# Patient Record
Sex: Female | Born: 1975 | Race: White | Hispanic: No | Smoking: Current every day smoker
Health system: Southern US, Community
[De-identification: ages and names within clinical notes are randomized; demographics above are authoritative.]

## PROBLEM LIST (undated history)

## (undated) HISTORY — PX: TUBAL LIGATION: SHX77

---

## 2013-01-06 ENCOUNTER — Encounter (HOSPITAL_COMMUNITY): Payer: Self-pay | Admitting: *Deleted

## 2013-01-06 ENCOUNTER — Emergency Department (HOSPITAL_COMMUNITY)
Admission: EM | Admit: 2013-01-06 | Discharge: 2013-01-06 | Disposition: A | Discharge status: Home / Self Care | Payer: Medicaid Other | Attending: Emergency Medicine | Admitting: Emergency Medicine

## 2013-01-06 DIAGNOSIS — Y9389 Activity, other specified: Secondary | ICD-10-CM | POA: Insufficient documentation

## 2013-01-06 DIAGNOSIS — Y9289 Other specified places as the place of occurrence of the external cause: Secondary | ICD-10-CM | POA: Insufficient documentation

## 2013-01-06 DIAGNOSIS — IMO0002 Reserved for concepts with insufficient information to code with codable children: Secondary | ICD-10-CM

## 2013-01-06 DIAGNOSIS — W260XXA Contact with knife, initial encounter: Secondary | ICD-10-CM | POA: Insufficient documentation

## 2013-01-06 DIAGNOSIS — F17200 Nicotine dependence, unspecified, uncomplicated: Secondary | ICD-10-CM | POA: Insufficient documentation

## 2013-01-06 DIAGNOSIS — W261XXA Contact with sword or dagger, initial encounter: Secondary | ICD-10-CM | POA: Insufficient documentation

## 2013-01-06 DIAGNOSIS — S61209A Unspecified open wound of unspecified finger without damage to nail, initial encounter: Secondary | ICD-10-CM | POA: Insufficient documentation

## 2013-01-06 MED ORDER — HYDROCODONE-ACETAMINOPHEN 5-325 MG PO TABS
1.0000 | ORAL_TABLET | Freq: Once | ORAL | Status: AC
  Administered 2013-01-06: 1 via ORAL
  Filled 2013-01-06: qty 1

## 2013-01-06 MED ORDER — HYDROCODONE-ACETAMINOPHEN 5-325 MG PO TABS: 1.0000 | ORAL_TABLET | ORAL | Status: DC | PRN

## 2013-01-06 MED ORDER — LIDOCAINE HCL (PF) 2 % IJ SOLN
INTRAMUSCULAR | Status: AC
  Filled 2013-01-06: qty 10

## 2013-01-06 NOTE — ED Notes (Signed)
Pt with lac to left thumb with butcher knife while stripping wire

## 2013-01-06 NOTE — ED Notes (Signed)
Patient being sutured 

## 2013-01-07 NOTE — ED Provider Notes (Signed)
History     CSN: [id number]  Arrival date & time 01/06/13  2044   First MD Initiated Contact with Patient 01/06/13 2144      Chief Complaint  Patient presents with  . Extremity Laceration    (Consider location/radiation/quality/duration/timing/severity/associated sxs/prior treatment) HPI Comments: Phyllis Allen is a 37 y.o. Female presenting with laceration to her left thumb when she slipped with a sharp knife as she was stripping old wire for recycling.  She denies numbness distal to the injury site and has FROM of her finger.  The wound has bled a lot but is now currently controlled with direct pressure.  She is up to date with her tetanus.     The history is provided by the patient.    No past medical history on file.  No past surgical history on file.  No family history on file.  History  Substance Use Topics  . Smoking status: Current Every Day Smoker    Types: Cigarettes  . Smokeless tobacco: Not on file  . Alcohol Use: No    OB History   Grav Para Term Preterm Abortions TAB SAB Ect Mult Living                  Review of Systems  Constitutional: Negative for fever and chills.  HENT: Negative for facial swelling.   Respiratory: Negative.   Cardiovascular: Negative.   Gastrointestinal: Negative for nausea.  Skin: Positive for wound.  Neurological: Negative for weakness and numbness.    Allergies  Review of patient's allergies indicates no known allergies.  Home Medications   Current Outpatient Rx  Name  Route  Sig  Dispense  Refill  . traMADol (ULTRAM) 50 MG tablet   Oral   Take 50 mg by mouth every 6 (six) hours as needed for pain.         Marland Kitchen HYDROcodone-acetaminophen (NORCO/VICODIN) 5-325 MG per tablet   Oral   Take 1 tablet by mouth every 4 (four) hours as needed for pain.   15 tablet   0     BP 127/105  Pulse 86  Temp(Src) 98.2 F (36.8 C) (Oral)  Resp 18  Ht 5\' 3"  (1.6 m)  Wt 145 lb (65.772 kg)  BMI 25.69 kg/m2  SpO2 98%  LMP  12/26/2012  Physical Exam  Constitutional: She is oriented to person, place, and time. She appears well-developed and well-nourished.  HENT:  Head: Normocephalic.  Cardiovascular: Normal rate.   Pulmonary/Chest: Effort normal.  Musculoskeletal: She exhibits tenderness.  Neurological: She is alert and oriented to person, place, and time. No sensory deficit.  Skin: Laceration noted.  2 cm subcutaneous laceration left lateral thumb involving the distal and proximal phalanxes.  Distal sensation intact,  Pt displays flexion and extension of the digit.  No deep structures visualized.    ED Course  Procedures (including critical care time)   LACERATION REPAIR Performed by: Burgess Amor Authorized by: Burgess Amor Consent: Verbal consent obtained. Risks and benefits: risks, benefits and alternatives were discussed Consent given by: patient Patient identity confirmed: provided demographic data Prepped and Draped in normal sterile fashion Wound explored  Laceration Location: left thumb  Laceration Length: 2cm  No Foreign Bodies seen or palpated.  Penrose drain applied to proximal thumb, secured with needle driver for hemostasis,  Duration 10 minutes.  Anesthesia: local infiltration  Local anesthetic: lidocaine 2% without epinephrine  Anesthetic total: 2 ml  Irrigation method: syringe Amount of cleaning: standard  Skin closure: ethilon 4-0  Number of sutures: 6  Technique: simple interrupted  Patient tolerance: Patient tolerated the procedure well with no immediate complications.   Labs Reviewed - No data to display No results found.   1. Laceration       MDM  Wound care instructions given.  Pt advised to have sutures removed in 10 days,  Return here sooner for any signs of infection including redness, swelling, worse pain or drainage of pus.  Dressing applied by Kingsley Plan, PA-C 01/07/13 0043  Burgess Amor, PA-C 01/07/13 1610

## 2013-01-07 NOTE — ED Provider Notes (Signed)
Medical screening examination/treatment/procedure(s) were performed by non-physician practitioner and as supervising physician I was immediately available for consultation/collaboration.  Blimy Napoleon R. Khaliya Golinski, MD 01/07/13 2326 

## 2013-04-26 ENCOUNTER — Emergency Department (HOSPITAL_COMMUNITY): Payer: Medicaid Other

## 2013-04-26 ENCOUNTER — Emergency Department (HOSPITAL_COMMUNITY)
Admission: EM | Admit: 2013-04-26 | Discharge: 2013-04-26 | Disposition: A | Discharge status: Home / Self Care | Payer: Medicaid Other | Attending: Emergency Medicine | Admitting: Emergency Medicine

## 2013-04-26 DIAGNOSIS — R109 Unspecified abdominal pain: Secondary | ICD-10-CM

## 2013-04-26 DIAGNOSIS — R0602 Shortness of breath: Secondary | ICD-10-CM | POA: Insufficient documentation

## 2013-04-26 DIAGNOSIS — Z79899 Other long term (current) drug therapy: Secondary | ICD-10-CM | POA: Insufficient documentation

## 2013-04-26 DIAGNOSIS — F17200 Nicotine dependence, unspecified, uncomplicated: Secondary | ICD-10-CM | POA: Insufficient documentation

## 2013-04-26 DIAGNOSIS — R197 Diarrhea, unspecified: Secondary | ICD-10-CM | POA: Insufficient documentation

## 2013-04-26 DIAGNOSIS — R51 Headache: Secondary | ICD-10-CM | POA: Insufficient documentation

## 2013-04-26 DIAGNOSIS — Z3202 Encounter for pregnancy test, result negative: Secondary | ICD-10-CM | POA: Insufficient documentation

## 2013-04-26 DIAGNOSIS — R1011 Right upper quadrant pain: Secondary | ICD-10-CM | POA: Insufficient documentation

## 2013-04-26 DIAGNOSIS — R110 Nausea: Secondary | ICD-10-CM | POA: Insufficient documentation

## 2013-04-26 LAB — COMPREHENSIVE METABOLIC PANEL
ALT: 11 U/L (ref 0–35)
AST: 15 U/L (ref 0–37)
Albumin: 3.5 g/dL (ref 3.5–5.2)
Alkaline Phosphatase: 45 U/L (ref 39–117)
Chloride: 102 mEq/L (ref 96–112)
Potassium: 4.3 mEq/L (ref 3.5–5.1)
Sodium: 137 mEq/L (ref 135–145)
Total Bilirubin: 0.2 mg/dL — ABNORMAL LOW (ref 0.3–1.2)
Total Protein: 6.3 g/dL (ref 6.0–8.3)

## 2013-04-26 LAB — CBC WITH DIFFERENTIAL/PLATELET
Basophils Absolute: 0 10*3/uL (ref 0.0–0.1)
Basophils Relative: 0 % (ref 0–1)
Eosinophils Absolute: 0.2 10*3/uL (ref 0.0–0.7)
Eosinophils Relative: 3 % (ref 0–5)
HCT: 40.4 % (ref 36.0–46.0)
Lymphocytes Relative: 53 % — ABNORMAL HIGH (ref 12–46)
MCH: 32.6 pg (ref 26.0–34.0)
MCHC: 33.7 g/dL (ref 30.0–36.0)
MCV: 96.9 fL (ref 78.0–100.0)
Monocytes Absolute: 0.3 10*3/uL (ref 0.1–1.0)
Platelets: 178 10*3/uL (ref 150–400)
RDW: 13.1 % (ref 11.5–15.5)

## 2013-04-26 LAB — URINALYSIS, ROUTINE W REFLEX MICROSCOPIC
Bilirubin Urine: NEGATIVE
Ketones, ur: NEGATIVE mg/dL
Nitrite: NEGATIVE
Protein, ur: NEGATIVE mg/dL
pH: 6.5 (ref 5.0–8.0)

## 2013-04-26 MED ORDER — PROMETHAZINE HCL 25 MG PO TABS: 25.0000 mg | ORAL_TABLET | Freq: Four times a day (QID) | ORAL | Status: DC | PRN

## 2013-04-26 MED ORDER — SODIUM CHLORIDE 0.9 % IV BOLUS (SEPSIS)
500.0000 mL | Freq: Once | INTRAVENOUS | Status: AC
  Administered 2013-04-26: 500 mL via INTRAVENOUS

## 2013-04-26 MED ORDER — IOHEXOL 300 MG/ML  SOLN
100.0000 mL | Freq: Once | INTRAMUSCULAR | Status: AC | PRN
  Administered 2013-04-26: 100 mL via INTRAVENOUS

## 2013-04-26 MED ORDER — ONDANSETRON HCL 4 MG/2ML IJ SOLN
4.0000 mg | Freq: Once | INTRAMUSCULAR | Status: AC
  Administered 2013-04-26: 4 mg via INTRAVENOUS
  Filled 2013-04-26: qty 2

## 2013-04-26 MED ORDER — HYDROCODONE-ACETAMINOPHEN 5-325 MG PO TABS: 1.0000 | ORAL_TABLET | Freq: Four times a day (QID) | ORAL | Status: DC | PRN

## 2013-04-26 MED ORDER — IOHEXOL 300 MG/ML  SOLN
50.0000 mL | Freq: Once | INTRAMUSCULAR | Status: AC | PRN
  Administered 2013-04-26: 50 mL via ORAL

## 2013-04-26 MED ORDER — SODIUM CHLORIDE 0.9 % IV SOLN
INTRAVENOUS | Status: DC
  Administered 2013-04-26: 14:00:00 via INTRAVENOUS

## 2013-04-26 MED ORDER — HYDROMORPHONE HCL PF 1 MG/ML IJ SOLN
1.0000 mg | Freq: Once | INTRAMUSCULAR | Status: AC
  Administered 2013-04-26: 1 mg via INTRAVENOUS
  Filled 2013-04-26: qty 1

## 2013-04-26 NOTE — ED Provider Notes (Signed)
CSN: [id number]     Arrival date & time 04/26/13  4098 History   This chart was scribed for Shelda Jakes, MD, by Yevette Edwards, ED Scribe. This patient was seen in room APA09/APA09 and the patient's care was started at 12:44 PM. First MD Initiated Contact with Patient 04/26/13 1237     No chief complaint on file.  HPI HPI Comments: Phyllis Allen is a 37 y.o. female who presents to the Emergency Department complaining of constant RUQ abdominal pain which began four days ago. She rates the pain as 8/10 and describes as "sharp." She denies any radiation of the abdominal pain. The pt reports that movement and palpation increase the pain. She has experienced nausea, a headache, diarrhea, and SOB as  associated symptoms. She denies any emesis, chest pain, or cold symptoms.  The pt is a daily smoker.   [facility] is where the pt's PCP is located.  No past medical history on file. No past surgical history on file. No family history on file. History  Substance Use Topics  . Smoking status: Current Every Day Smoker    Types: Cigarettes  . Smokeless tobacco: Not on file  . Alcohol Use: No   No OB history provided.  Review of Systems  Constitutional: Negative for fever and chills.  HENT: Negative for congestion, sore throat, rhinorrhea and sneezing.   Eyes: Negative for visual disturbance.  Respiratory: Positive for shortness of breath.   Cardiovascular: Negative for chest pain and leg swelling.  Gastrointestinal: Positive for nausea, abdominal pain and diarrhea (Two days ago; since resolved. Approximately 4 episodes of diarrhea. ). Negative for vomiting and blood in stool.  Genitourinary: Negative for dysuria and hematuria.  Musculoskeletal: Negative for back pain.  Skin: Negative for rash.  Neurological: Positive for headaches (Approximately 3 weeks; not similar to prior headaches).  Hematological: Does not bruise/bleed easily.  All other systems reviewed and are  negative.    Allergies  Amoxicillin  Home Medications   Current Outpatient Rx  Name  Route  Sig  Dispense  Refill  . amphetamine-dextroamphetamine (ADDERALL) 20 MG tablet   Oral   Take 20 mg by mouth 2 (two) times daily.         . diazepam (VALIUM) 10 MG tablet   Oral   Take 5-10 mg by mouth daily as needed for anxiety.         Marland Kitchen lisdexamfetamine (VYVANSE) 40 MG capsule   Oral   Take 40 mg by mouth daily.         . traMADol (ULTRAM) 50 MG tablet   Oral   Take 100 mg by mouth every 6 (six) hours as needed for pain.           Triage Vitals: BP 119/74  Pulse 61  Temp(Src) 98.1 F (36.7 C) (Oral)  Resp 16  Ht 5\' 3"  (1.6 m)  Wt 140 lb (63.504 kg)  BMI 24.81 kg/m2  SpO2 100%  Physical Exam  Nursing note and vitals reviewed. Constitutional: She is oriented to person, place, and time. She appears well-developed and well-nourished. No distress.  HENT:  Head: Normocephalic and atraumatic.  Eyes: EOM are normal.  Neck: Neck supple. No tracheal deviation present.  Cardiovascular: Normal rate, regular rhythm and normal heart sounds.   No murmur heard. Pulmonary/Chest: Effort normal and breath sounds normal. No respiratory distress. She has no wheezes. She has no rales.  Abdominal: There is tenderness. There is no guarding.  Tenderness to RUQ.  Musculoskeletal: Normal range of motion. She exhibits no edema.  Neurological: She is alert and oriented to person, place, and time. No cranial nerve deficit.  Skin: Skin is warm and dry.  Psychiatric: She has a normal mood and affect. Her behavior is normal.    ED Course  Procedures (including critical care time)  DIAGNOSTIC STUDIES: Oxygen Saturation is 100% on room air, normal by my interpretation.    COORDINATION OF CARE:  12:49 PM- Discussed treatment plan with patient, and the patient agreed to the plan.   Labs Review Labs Reviewed  COMPREHENSIVE METABOLIC PANEL - Abnormal; Notable for the following:     Total Bilirubin 0.2 (*)    GFR calc non Af Amer 86 (*)    All other components within normal limits  CBC WITH DIFFERENTIAL - Abnormal; Notable for the following:    Neutrophils Relative % 38 (*)    Lymphocytes Relative 53 (*)    All other components within normal limits  LIPASE, BLOOD  URINALYSIS, ROUTINE W REFLEX MICROSCOPIC  PREGNANCY, URINE   Results for orders placed during the hospital encounter of 04/26/13  LIPASE, BLOOD      Result Value Range   Lipase 48  11 - 59 U/L  COMPREHENSIVE METABOLIC PANEL      Result Value Range   Sodium 137  135 - 145 mEq/L   Potassium 4.3  3.5 - 5.1 mEq/L   Chloride 102  96 - 112 mEq/L   CO2 29  19 - 32 mEq/L   Glucose, Bld 84  70 - 99 mg/dL   BUN 10  6 - 23 mg/dL   Creatinine, Ser 3.24  0.50 - 1.10 mg/dL   Calcium 8.7  8.4 - 40.1 mg/dL   Total Protein 6.3  6.0 - 8.3 g/dL   Albumin 3.5  3.5 - 5.2 g/dL   AST 15  0 - 37 U/L   ALT 11  0 - 35 U/L   Alkaline Phosphatase 45  39 - 117 U/L   Total Bilirubin 0.2 (*) 0.3 - 1.2 mg/dL   GFR calc non Af Amer 86 (*) >90 mL/min   GFR calc Af Amer >90  >90 mL/min  CBC WITH DIFFERENTIAL      Result Value Range   WBC 6.5  4.0 - 10.5 K/uL   RBC 4.17  3.87 - 5.11 MIL/uL   Hemoglobin 13.6  12.0 - 15.0 g/dL   HCT 02.7  25.3 - 66.4 %   MCV 96.9  78.0 - 100.0 fL   MCH 32.6  26.0 - 34.0 pg   MCHC 33.7  30.0 - 36.0 g/dL   RDW 40.3  47.4 - 25.9 %   Platelets 178  150 - 400 K/uL   Neutrophils Relative % 38 (*) 43 - 77 %   Neutro Abs 2.5  1.7 - 7.7 K/uL   Lymphocytes Relative 53 (*) 12 - 46 %   Lymphs Abs 3.5  0.7 - 4.0 K/uL   Monocytes Relative 5  3 - 12 %   Monocytes Absolute 0.3  0.1 - 1.0 K/uL   Eosinophils Relative 3  0 - 5 %   Eosinophils Absolute 0.2  0.0 - 0.7 K/uL   Basophils Relative 0  0 - 1 %   Basophils Absolute 0.0  0.0 - 0.1 K/uL  URINALYSIS, ROUTINE W REFLEX MICROSCOPIC      Result Value Range   Color, Urine YELLOW  YELLOW   APPearance CLEAR  CLEAR   Specific  Gravity, Urine 1.025  1.005  - 1.030   pH 6.5  5.0 - 8.0   Glucose, UA NEGATIVE  NEGATIVE mg/dL   Hgb urine dipstick NEGATIVE  NEGATIVE   Bilirubin Urine NEGATIVE  NEGATIVE   Ketones, ur NEGATIVE  NEGATIVE mg/dL   Protein, ur NEGATIVE  NEGATIVE mg/dL   Urobilinogen, UA 0.2  0.0 - 1.0 mg/dL   Nitrite NEGATIVE  NEGATIVE   Leukocytes, UA NEGATIVE  NEGATIVE  PREGNANCY, URINE      Result Value Range   Preg Test, Ur NEGATIVE  NEGATIVE    Imaging Review Dg Chest 2 View  04/26/2013   CLINICAL DATA:  Right upper quadrant pain  EXAM: CHEST  2 VIEW  COMPARISON:  None.  FINDINGS: The heart size and mediastinal contours are within normal limits. Both lungs are clear. The visualized skeletal structures are unremarkable.  IMPRESSION: No active cardiopulmonary disease.   Electronically Signed   By: Natasha Mead   On: 04/26/2013 13:48   US Abdomen Limited Ruq  04/26/2013   CLINICAL DATA:  Right upper quadrant pain  EXAM: US ABDOMEN LIMITED - RIGHT UPPER QUADRANT  COMPARISON:  None.  FINDINGS: Gallbladder:  No gallstones, gallbladder wall thickening, or pericholecystic fluid.  Common bile duct  Diameter: 2.57mm  Liver:  No focal lesion identified. Within normal limits in parenchymal echogenicity.  IMPRESSION: No acute abnormality noted.   Electronically Signed   By: Alcide Clever   On: 04/26/2013 14:31    MDM   1. Abdominal pain    Patient symptoms suggest of of biliary colic or gallbladder disease. However ultrasound of the gallbladder is completely normal. Will proceed with CT of the abdomen for further evaluation. In addition chest x-ray negative for any right lower lobe pneumonia or other abnormalities. Patient's labs without any significant Dolphus Jenny is no leukocytosis no liver function test abnormalities no evidence of pancreatitis. If CT of abdomen is negative patient can be discharged home on pain medication antinausea medicine.  *I personally performed the services described in this documentation, which was scribed in my  presence. The recorded information has been reviewed and is accurate.     Shelda Jakes, MD 04/26/13 [phone]

## 2013-04-26 NOTE — ED Notes (Signed)
C/o RUQ pain x 3 days - reports onset after lifting laundry basket.  Reports pain 8/10 and "feels like someone punched me".  States pain is worse and sharp with movement and palpation. C/o nausea, denies vomiting; reports onset of diarrhea yesterday; states took imodium one tablet this AM and diarrhea has stopped.

## 2013-04-26 NOTE — ED Notes (Signed)
EDP notified of pt's test results, EDP will review and discuss with pt. Pt notified.

## 2013-04-26 NOTE — Progress Notes (Signed)
No PCP in computer, but pt states PCP is in [facility] [address] in her old doctor's office and she cannot remember her name- this entered in computer

## 2014-03-11 ENCOUNTER — Emergency Department (HOSPITAL_COMMUNITY)
Admission: EM | Admit: 2014-03-11 | Discharge: 2014-03-11 | Disposition: A | Discharge status: Home / Self Care | Payer: Medicaid Other | Attending: Emergency Medicine | Admitting: Emergency Medicine

## 2014-03-11 ENCOUNTER — Encounter (HOSPITAL_COMMUNITY): Payer: Self-pay | Admitting: Emergency Medicine

## 2014-03-11 ENCOUNTER — Emergency Department (HOSPITAL_COMMUNITY): Payer: Medicaid Other

## 2014-03-11 DIAGNOSIS — S0010XA Contusion of unspecified eyelid and periocular area, initial encounter: Secondary | ICD-10-CM | POA: Diagnosis not present

## 2014-03-11 DIAGNOSIS — Z880 Allergy status to penicillin: Secondary | ICD-10-CM | POA: Insufficient documentation

## 2014-03-11 DIAGNOSIS — IMO0002 Reserved for concepts with insufficient information to code with codable children: Secondary | ICD-10-CM | POA: Diagnosis not present

## 2014-03-11 DIAGNOSIS — Y9389 Activity, other specified: Secondary | ICD-10-CM | POA: Diagnosis not present

## 2014-03-11 DIAGNOSIS — F0781 Postconcussional syndrome: Secondary | ICD-10-CM | POA: Insufficient documentation

## 2014-03-11 DIAGNOSIS — Z79899 Other long term (current) drug therapy: Secondary | ICD-10-CM | POA: Insufficient documentation

## 2014-03-11 DIAGNOSIS — S0512XA Contusion of eyeball and orbital tissues, left eye, initial encounter: Secondary | ICD-10-CM

## 2014-03-11 DIAGNOSIS — S0510XA Contusion of eyeball and orbital tissues, unspecified eye, initial encounter: Secondary | ICD-10-CM | POA: Diagnosis present

## 2014-03-11 DIAGNOSIS — Y929 Unspecified place or not applicable: Secondary | ICD-10-CM | POA: Diagnosis not present

## 2014-03-11 DIAGNOSIS — F17200 Nicotine dependence, unspecified, uncomplicated: Secondary | ICD-10-CM | POA: Diagnosis not present

## 2014-03-11 DIAGNOSIS — H1132 Conjunctival hemorrhage, left eye: Secondary | ICD-10-CM

## 2014-03-11 MED ORDER — HYDROCODONE-ACETAMINOPHEN 5-325 MG PO TABS
1.0000 | ORAL_TABLET | Freq: Once | ORAL | Status: AC
  Administered 2014-03-11: 1 via ORAL
  Filled 2014-03-11: qty 1

## 2014-03-11 MED ORDER — TRAMADOL HCL 50 MG PO TABS: 50.0000 mg | ORAL_TABLET | Freq: Four times a day (QID) | ORAL | Status: DC | PRN

## 2014-03-11 NOTE — ED Provider Notes (Signed)
CSN: [id number]     Arrival date & time 03/11/14  1831 History   First MD Initiated Contact with Patient 03/11/14 1847     Chief Complaint  Patient presents with  . Eye Injury     (Consider location/radiation/quality/duration/timing/severity/associated sxs/prior Treatment) HPI  Phyllis Allen is a 38 y.o. female complaining of pain and swelling to left thigh worsening over the course of 4 days when she was hit with a softball in the left temple. Patient states that she may have passed out for a moment at the time. She denies any nausea, vomiting, dysarthria, ataxia, unilateral weakness, cervicalgia. Patient endorses generalized fatigue and sleeping more than normal, mild global headache, blurred vision. Patient does not wear contact lenses, but wears reading glasses.  History reviewed. No pertinent past medical history. Past Surgical History  Procedure Laterality Date  . Tubal ligation     No family history on file. History  Substance Use Topics  . Smoking status: Current Every Day Smoker -- 0.50 packs/day    Types: Cigarettes  . Smokeless tobacco: Not on file  . Alcohol Use: No   OB History   Grav Para Term Preterm Abortions TAB SAB Ect Mult Living                 Review of Systems  10 systems reviewed and found to be negative, except as noted in the HPI.   Allergies  Amoxicillin and Ciprofloxacin  Home Medications   Prior to Admission medications   Medication Sig Start Date End Date Taking? Authorizing Provider  ALPRAZolam Prudy Feeler(XANAX) 1 MG tablet Take 1 mg by mouth 2 (two) times daily.   Yes Historical Provider, MD  amphetamine-dextroamphetamine (ADDERALL) 30 MG tablet Take 30 mg by mouth 2 (two) times daily.   Yes Historical Provider, MD  lisdexamfetamine (VYVANSE) 70 MG capsule Take 70 mg by mouth daily.   Yes Historical Provider, MD  traMADol (ULTRAM) 50 MG tablet Take 1 tablet (50 mg total) by mouth every 6 (six) hours as needed. 03/11/14   Ashan Cueva, PA-C   BP  131/80  Pulse 70  Temp(Src) 98.7 F (37.1 C) (Oral)  Resp 20  Ht 5\' 2"  (1.575 m)  Wt 140 lb (63.504 kg)  BMI 25.60 kg/m2  SpO2 100%  LMP 02/25/2014 Physical Exam  Nursing note and vitals reviewed. Constitutional: She is oriented to person, place, and time. She appears well-developed and well-nourished. No distress.  HENT:  Head: Normocephalic.  Mouth/Throat: Oropharynx is clear and moist.  Left periorbital ecchymoses and subconjunctival hematoma. No chemosis, pupils are equal round and reactive to light, extra ocular movements are intact with pain at far left lateral gaze. There is tenderness but no crepitance to the left lateral orbital rim.  Eyes: Conjunctivae and EOM are normal. Pupils are equal, round, and reactive to light.  Neck:  No midline C-spine  tenderness to palpation or step-offs appreciated. Patient has full range of motion without pain.   Cardiovascular: Normal rate, regular rhythm and intact distal pulses.   Pulmonary/Chest: Effort normal and breath sounds normal. No stridor.  Abdominal: Soft.  Musculoskeletal: Normal range of motion.  Neurological: She is alert and oriented to person, place, and time.  Follows commands, Clear, goal oriented speech, Strength is 5 out of 5x4 extremities, patient ambulates with a coordinated in nonantalgic gait. Sensation is grossly intact.   Psychiatric: She has a normal mood and affect.    ED Course  Procedures (including critical care time) Labs Review Labs  Reviewed - No data to display  Imaging Review Ct Head Wo Contrast  03/11/2014   CLINICAL DATA:  Hit in left orbit with softball.  EXAM: CT HEAD WITHOUT CONTRAST  CT MAXILLOFACIAL WITHOUT CONTRAST  TECHNIQUE: Multidetector CT imaging of the head and maxillofacial structures were performed using the standard protocol without intravenous contrast. Multiplanar CT image reconstructions of the maxillofacial structures were also generated.  COMPARISON:  04/26/2013  FINDINGS: CT HEAD  FINDINGS  No acute intracranial abnormality. Specifically, no hemorrhage, hydrocephalus, mass lesion, acute infarction, or significant intracranial injury. No acute calvarial abnormality. Visualized paranasal sinuses and mastoids clear. Orbital soft tissues unremarkable.  CT MAXILLOFACIAL FINDINGS  Soft tissue swelling over the left orbit and face. No orbital fracture. Zygomatic arches and mandible intact. Nasal bones intact. Paranasal sinuses clear. Orbital soft tissues unremarkable.  IMPRESSION: No evidence of orbital/facial fracture.  No acute intracranial abnormality.   Electronically Signed   By: Charlett Nose M.D.   On: 03/11/2014 19:19   Ct Maxillofacial Wo Cm  03/11/2014   CLINICAL DATA:  Hit in left orbit with softball.  EXAM: CT HEAD WITHOUT CONTRAST  CT MAXILLOFACIAL WITHOUT CONTRAST  TECHNIQUE: Multidetector CT imaging of the head and maxillofacial structures were performed using the standard protocol without intravenous contrast. Multiplanar CT image reconstructions of the maxillofacial structures were also generated.  COMPARISON:  04/26/2013  FINDINGS: CT HEAD FINDINGS  No acute intracranial abnormality. Specifically, no hemorrhage, hydrocephalus, mass lesion, acute infarction, or significant intracranial injury. No acute calvarial abnormality. Visualized paranasal sinuses and mastoids clear. Orbital soft tissues unremarkable.  CT MAXILLOFACIAL FINDINGS  Soft tissue swelling over the left orbit and face. No orbital fracture. Zygomatic arches and mandible intact. Nasal bones intact. Paranasal sinuses clear. Orbital soft tissues unremarkable.  IMPRESSION: No evidence of orbital/facial fracture.  No acute intracranial abnormality.   Electronically Signed   By: Charlett Nose M.D.   On: 03/11/2014 19:19     EKG Interpretation None      MDM   Final diagnoses:  Periorbital contusion of left eye, initial encounter  Subconjunctival hematoma, left  Post concussion syndrome    Filed Vitals:    03/11/14 1843  BP: 131/80  Pulse: 70  Temp: 98.7 F (37.1 C)  TempSrc: Oral  Resp: 20  Height: 5\' 2"  (1.575 m)  Weight: 140 lb (63.504 kg)  SpO2: 100%    Medications  HYDROcodone-acetaminophen (NORCO/VICODIN) 5-325 MG per tablet 1 tablet (1 tablet Oral Given 03/11/14 1859)    Phyllis Allen is a 38 y.o. female presenting with left periorbital ecchymoses after she was hit with a softball 4 days ago. Extraocular movements are intact, there is no overt entrapment. Patient has headache which is atypical for her in addition she is generally fatigued and sleeping more than normal here. Likely postconcussive syndrome however I will order a CAT scan as patient had loss of consciousness.   Maxillofacial and head CT are both negative. We have discussed secondary impact syndrome and I advised her not to undertake any activity that could result in head trauma. Patient and her family verbalize her understanding. We'll give her referral to neurology and resource guide for establishing primary care.  Evaluation does not show pathology that would require ongoing emergent intervention or inpatient treatment. Pt is hemodynamically stable and mentating appropriately. Discussed findings and plan with patient/guardian, who agrees with care plan. All questions answered. Return precautions discussed and outpatient follow up given.   New Prescriptions   TRAMADOL (ULTRAM) 50 MG  TABLET    Take 1 tablet (50 mg total) by mouth every 6 (six) hours as needed.         Wynetta Emery, PA-C 03/11/14 1932

## 2014-03-11 NOTE — ED Notes (Signed)
Pt hit in eye with softball x 4 days ago.

## 2014-03-11 NOTE — Discharge Instructions (Signed)
Do not participate in any sports or any activities that could result in head trauma until you are cleared by your  primary care physician or neurologist.    Do not hesitate to return to the emergency room for any new, worsening or concerning symptoms.  Please obtain primary care using resource guide below. But the minute you were seen in the emergency room and that they will need to obtain records for further outpatient management.   Emergency Department Resource Guide 1) Find a Doctor and Pay Out of Pocket Although you won't have to find out who is covered by your insurance plan, it is a good idea to ask around and get recommendations. You will then need to call the office and see if the doctor you have chosen will accept you as a new patient and what types of options they offer for patients who are self-pay. Some doctors offer discounts or will set up payment plans for their patients who do not have insurance, but you will need to ask so you aren't surprised when you get to your appointment.  2) Contact Your Local Health Department Not all health departments have doctors that can see patients for sick visits, but many do, so it is worth a call to see if yours does. If you don't know where your local health department is, you can check in your phone book. The CDC also has a tool to help you locate your state's health department, and many state websites also have listings of all of their local health departments.  3) Find a Walk-in Clinic If your illness is not likely to be very severe or complicated, you may want to try a walk in clinic. These are popping up all over the country in pharmacies, drugstores, and shopping centers. They're usually staffed by nurse practitioners or physician assistants that have been trained to treat common illnesses and complaints. They're usually fairly quick and inexpensive. However, if you have serious medical issues or chronic medical problems, these are probably not  your best option.  No Primary Care Doctor: - Call Health Connect at  [phone] - they can help you locate a primary care doctor that  accepts your insurance, provides certain services, etc. - Physician Referral Service- [phone]  Chronic Pain Problems: Organization         Address  Phone   Notes  [facility]  [phone] Patients need to be referred by their primary care doctor.   Medication Assistance: Organization         Address  Phone   Notes  [facility] Medication [facility] [address] [place], [place] [address] [phone] --Must be a resident of [facility] -- Must have NO insurance coverage whatsoever (no Medicaid/ Medicare, etc.) -- The pt. MUST have a primary care doctor that directs their care regularly and follows them in the community   [facility]ist  [phone]   [employer]  [phone]    Agencies that provide inexpensive medical care: Organization         Address  Phone   Notes  [facility]  [phone]   [facility]    [phone]   [facility] [address] [place], [place] [address] [phone]   [facility] [address] [place] [place] [phone]   Planned Parenthood    [phone]   [facility]    [phone])  [place]  [address], [place] Phone:  [phone], Fax:  [phone] Hours of Operation:  9 am - 6 pm, M-F.  Also accepts Medicaid/Medicare and self-pay.  [facility] for [place], [address], [place] Phone: [phone], Fax: [phone]. Hours of Operation:  8:30 am - 5:30 pm, M-F.  Also accepts Medicaid and self-pay.  [facility] [place] [address] [place] Phone: [phone]   [place], [place], [place] [phone], Ext. [phone] Mondays & Thursdays: 7-9 AM.  First  15 patients are seen on a first come, first serve basis.    [place] Providers:  Organization         Address  Phone   Notes  [facility] [address],  [phone] Also accepts self-pay patients.  [facility] [address], [place]  [phone]   [place], [address], [place] [phone]   [facility] [address], [place] [phone]   Lucianne Lei [address], [place]   [phone] Only accepts [place] Access [place] patients after they have their name applied to their card.   Self-Pay (no insurance) in [facility]:  Organization         Address  Phone   Notes  Sickle Cell Patients, [facility] Internal Medicine [place] [phone]   [facility] [place] [phone]   [facility] [place]  [place], [place], [place] [phone]   [facility]/Dr. Osei-Bonsu  [address] [place] or [address], [place] [phone] Phone number for both [place] and [place] locations is the same.  [facility] and [place] [address] [place] [phone]   [facility] [address] [place] or [address] [phone]   [facility] [address] [place] [phone], phone; [phone], fax Sees patients 1st and 3rd Saturday of every month.  Must not qualify for public or private insurance (i.e. Medicaid, Medicare, [place] Health Choice, Veterans' Benefits)  Household income should be no more than 200% of the poverty level The clinic cannot treat you if you are pregnant or think you are pregnant  Sexually transmitted diseases are not treated at the clinic.    Dental  Care: Organization         Address  Phone  Notes  [facility] Department of [facility] [place] [phone] Accepts children up to age 29 who are enrolled in [place] or [place]; pregnant women with a Medicaid card; and children who have applied for Medicaid or [place] Health Choice, but were declined, whose parents can pay a reduced fee at time of service.  [facility] of [facility]  [address], [place] [phone] Accepts children up to age 38 who are enrolled in [place] or [place]; pregnant women with a Medicaid card; and children who have applied for Medicaid or [place] Health Choice, but were declined, whose parents can pay a reduced fee at time of service.  [place] Adult Dental Access  PROGRAM  [place] [phone] Patients are seen by appointment only. Walk-ins are not accepted. [place] will see patients 24 years of age and older. Monday - Tuesday (8am-5pm) Most Wednesdays (8:30-5pm) $30 per visit, cash only  [facility] Adult Dental Access PROGRAM  [address], [facility] [phone] Patients are seen by appointment only. Walk-ins are not accepted. [place] will see patients 16 years of age and older. One Wednesday Evening (Monthly: Volunteer Based).  $30 per visit, cash only  [place]  [phone] for adults; Children under age 49, call Graduate Pediatric Dentistry at [phone]. Children aged 70-14, please call [phone] to request a pediatric application.  Dental services are provided in all areas of dental care including fillings, crowns and bridges, complete and partial dentures, implants, gum treatment, root canals, and extractions. Preventive care is also provided. Treatment is provided to both adults and children. Patients are selected via a lottery and there is often a waiting list.   [facility] [address], [place]  [phone] [email or web address]   [facility] [address] [place], [place] [phone], Ext. [phone] Second and Fourth Thursday of each month, opens at 6:30 AM; Clinic ends at 9 AM.  Patients are seen on a first-come first-served basis, and a limited number are seen during each clinic.   [facility]  [address] Hillard Danker [place], [place] [phone]   Eligibility Requirements You must have lived in [place], [place], or [place] counties for at least the last three months.   You cannot be eligible for state or federal sponsored [employer], including [employer], [place], or [employer].   You generally cannot be eligible for healthcare insurance through your employer.    How to apply: Eligibility screenings are held every Tuesday and Wednesday afternoon from 1:00 pm until 4:00 pm. You do not need an appointment for the interview!  [facility] [address], [place], [place]   [place]  [place]  Jette  [phone]    Behavioral Health Resources in the Community: Intensive Outpatient Programs Organization         Address  Phone  Notes  [place]. [address] [place], [place] [phone]   [facility] Outpatient [address], [place], [place]   ADS: Alcohol & Drug S[facility] [address] [place], [place]   [place] [address],  [place], [place] or [phone]   Substance Abuse Resources Organization         Address  Phone  Notes  Alcohol and Drug Services  [phone]   [place]  [phone]   [facility] [place]   Chinita Pester  [phone]   Residential & Outpatient Substance Abuse Program  [phone]    Psychological Services Organization         Address  Phone  Notes  [facility] [place]  [place]  [phone]   [place] [address], [place] or [phone]    Mobile Crisis Teams Organization         Address  Phone  Notes  [facility], Mobile Crisis Care Unit  [phone]   [facility]  [address] [place], [place]  [phone]   [facility] [address] [place] [phone]    Self-Help/Support Groups Organization         Address  Phone             Notes  [facility] [place] - variety of support groups  336- [phone] Call for more information  Narcotics Anonymous (NA), [facility] [address], [place]  2 meetings at this location   Statisticianesidential Treatment Programs Organization         Address  Phone  Notes  [facility] [address],    [place]  [phone]   [facility]  [address], [place] [phone], [place], [place] [phone]   [facility] [address], [place] [phone] Admissions: 8am-3pm M-F  [facility] [address]    [place], [place] [phone]   [facility] [address], [place], [place] [phone]   [facility] [address]  [place], [place] [phone]   [facility] - Intensive Outpatient [address], [place], [place] [phone]   [facility] (Addiction Recovery Care [facility] [address]  [place], [place] [phone] or [phone]   [facility] ([facility]) [address] [place], [place] [phone] Accepts Medicaid  [facility] [place] [address]  [place] [phone] Substance Abuse/Addiction Treatment   [facility] Behavioral Health Resources Organization         Address  Phone  Notes  [facility]  [phone]   Angie FavaJulie Brannon, PhD [address], [address] [place], [place]   [phone] or [phone]   [facility]   [address] [place], [place] [phone]   [facility] [address], [place], [place] [phone] Insurance/Medicaid/sponsorship through [facility]  [facility] [address]                                    [place], [place] [phone] Therapy/tele-psych/case  [facility] [address] St[address].   [place], [place] [phone]    Dr. Lolly MustacheArfeen  [phone]   [facility]  [employer] [facility] 1) [address], [place] 2) [address], [place] 3)  [address], [place] [phone]  [phone]   [facility] Child Abuse Hotline [phone]) 3600092051 or [phone]) [phone] (After Hours)       Concussion A concussion, or closed-head injury, is a brain injury caused by a direct blow to the head or by a quick and sudden movement (jolt) of the head or neck. Concussions are usually not life-threatening. Even so, the effects of a concussion can be serious. If you have had a concussion before, you are more likely to experience concussion-like symptoms after a direct blow to the head.  CAUSES  Direct blow to the head, such as from running into another player during a soccer game, being hit in a fight, or hitting your head on a hard surface.  A jolt of the head or neck that causes the brain to move back and forth inside the skull, such as in a car crash. SIGNS AND SYMPTOMS The signs of a concussion can be hard to notice. Early on, they may be missed by you, family members, and health  care providers. You may look fine but act or feel differently. Symptoms are usually temporary, but they may last for days, weeks, or even longer. Some symptoms may appear right away while others may not show up for hours or days. Every head injury is different. Symptoms include:  Mild to  moderate headaches that will not go away.  A feeling of pressure inside your head.  Having more trouble than usual:  Learning or remembering things you have heard.  Answering questions.  Paying attention or concentrating.  Organizing daily tasks.  Making decisions and solving problems.  Slowness in thinking, acting or reacting, speaking, or reading.  Getting lost or being easily confused.  Feeling tired all the time or lacking energy (fatigued).  Feeling drowsy.  Sleep disturbances.  Sleeping more than usual.  Sleeping less than usual.  Trouble falling asleep.  Trouble sleeping (insomnia).  Loss of balance or feeling lightheaded or dizzy.  Nausea or vomiting.  Numbness or tingling.  Increased sensitivity to:  Sounds.  Lights.  Distractions.  Vision problems or eyes that tire easily.  Diminished sense of taste or smell.  Ringing in the ears.  Mood changes such as feeling sad or anxious.  Becoming easily irritated or angry for little or no reason.  Lack of motivation.  Seeing or hearing things other people do not see or hear (hallucinations). DIAGNOSIS Your health care provider can usually diagnose a concussion based on a description of your injury and symptoms. He or she will ask whether you passed out (lost consciousness) and whether you are having trouble remembering events that happened right before and during your injury. Your evaluation might include:  A brain scan to look for signs of injury to the brain. Even if the test shows no injury, you may still have a concussion.  Blood tests to be sure other problems are not present. TREATMENT  Concussions are usually treated in an emergency department, in urgent care, or at a clinic. You may need to stay in the hospital overnight for further treatment.  Tell your health care provider if you are taking any medicines, including prescription medicines, over-the-counter medicines, and natural  remedies. Some medicines, such as blood thinners (anticoagulants) and aspirin, may increase the chance of complications. Also tell your health care provider whether you have had alcohol or are taking illegal drugs. This information may affect treatment.  Your health care provider will send you home with important instructions to follow.  How fast you will recover from a concussion depends on many factors. These factors include how severe your concussion is, what part of your brain was injured, your age, and how healthy you were before the concussion.  Most people with mild injuries recover fully. Recovery can take time. In general, recovery is slower in older persons. Also, persons who have had a concussion in the past or have other medical problems may find that it takes longer to recover from their current injury. HOME CARE INSTRUCTIONS General Instructions  Carefully follow the directions your health care provider gave you.  Only take over-the-counter or prescription medicines for pain, discomfort, or fever as directed by your health care provider.  Take only those medicines that your health care provider has approved.  Do not drink alcohol until your health care provider says you are well enough to do so. Alcohol and certain other drugs may slow your recovery and can put you at risk of further injury.  If it is harder than usual to remember things, write  them down.  If you are easily distracted, try to do one thing at a time. For example, do not try to watch TV while fixing dinner.  Talk with family members or close friends when making important decisions.  Keep all follow-up appointments. Repeated evaluation of your symptoms is recommended for your recovery.  Watch your symptoms and tell others to do the same. Complications sometimes occur after a concussion. Older adults with a brain injury may have a higher risk of serious complications, such as a blood clot on the brain.  Tell  your teachers, school nurse, school counselor, coach, athletic trainer, or work Production designer, theatre/television/film about your injury, symptoms, and restrictions. Tell them about what you can or cannot do. They should watch for:  Increased problems with attention or concentration.  Increased difficulty remembering or learning new information.  Increased time needed to complete tasks or assignments.  Increased irritability or decreased ability to cope with stress.  Increased symptoms.  Rest. Rest helps the brain to heal. Make sure you:  Get plenty of sleep at night. Avoid staying up late at night.  Keep the same bedtime hours on weekends and weekdays.  Rest during the day. Take daytime naps or rest breaks when you feel tired.  Limit activities that require a lot of thought or concentration. These include:  Doing homework or job-related work.  Watching TV.  Working on the computer.  Avoid any situation where there is potential for another head injury (football, hockey, soccer, basketball, martial arts, downhill snow sports and horseback riding). Your condition will get worse every time you experience a concussion. You should avoid these activities until you are evaluated by the appropriate follow-up health care providers. Returning To Your Regular Activities You will need to return to your normal activities slowly, not all at once. You must give your body and brain enough time for recovery.  Do not return to sports or other athletic activities until your health care provider tells you it is safe to do so.  Ask your health care provider when you can drive, ride a bicycle, or operate heavy machinery. Your ability to react may be slower after a brain injury. Never do these activities if you are dizzy.  Ask your health care provider about when you can return to work or school. Preventing Another Concussion It is very important to avoid another brain injury, especially before you have recovered. In rare cases,  another injury can lead to permanent brain damage, brain swelling, or death. The risk of this is greatest during the first 7-10 days after a head injury. Avoid injuries by:  Wearing a seat belt when riding in a car.  Drinking alcohol only in moderation.  Wearing a helmet when biking, skiing, skateboarding, skating, or doing similar activities.  Avoiding activities that could lead to a second concussion, such as contact or recreational sports, until your health care provider says it is okay.  Taking safety measures in your home.  Remove clutter and tripping hazards from floors and stairways.  Use grab bars in bathrooms and handrails by stairs.  Place non-slip mats on floors and in bathtubs.  Improve lighting in dim areas. SEEK MEDICAL CARE IF:  You have increased problems paying attention or concentrating.  You have increased difficulty remembering or learning new information.  You need more time to complete tasks or assignments than before.  You have increased irritability or decreased ability to cope with stress.  You have more symptoms than before. Seek medical care if  you have any of the following symptoms for more than 2 weeks after your injury:  Lasting (chronic) headaches.  Dizziness or balance problems.  Nausea.  Vision problems.  Increased sensitivity to noise or light.  Depression or mood swings.  Anxiety or irritability.  Memory problems.  Difficulty concentrating or paying attention.  Sleep problems.  Feeling tired all the time. SEEK IMMEDIATE MEDICAL CARE IF:  You have severe or worsening headaches. These may be a sign of a blood clot in the brain.  You have weakness (even if only in one hand, leg, or part of the face).  You have numbness.  You have decreased coordination.  You vomit repeatedly.  You have increased sleepiness.  One pupil is larger than the other.  You have convulsions.  You have slurred speech.  You have increased  confusion. This may be a sign of a blood clot in the brain.  You have increased restlessness, agitation, or irritability.  You are unable to recognize people or places.  You have neck pain.  It is difficult to wake you up.  You have unusual behavior changes.  You lose consciousness. MAKE SURE YOU:  Understand these instructions.  Will watch your condition.  Will get help right away if you are not doing well or get worse. Document Released: 10/04/2003 Document Revised: 07/19/2013 Document Reviewed: 02/03/2013 [facility] Patient Information 2015 [place], [place]. This information is not intended to replace advice given to you by your health care provider. Make sure you discuss any questions you have with your health care provider.

## 2014-03-12 NOTE — ED Provider Notes (Signed)
Medical screening examination/treatment/procedure(s) were performed by non-physician practitioner and as supervising physician I was immediately available for consultation/collaboration.    Linwood DibblesJon Briannon Boggio, MD 03/12/14 [phone]

## 2014-05-09 ENCOUNTER — Encounter (HOSPITAL_COMMUNITY): Payer: Self-pay | Admitting: Emergency Medicine

## 2014-05-09 ENCOUNTER — Emergency Department (HOSPITAL_COMMUNITY)
Admission: EM | Admit: 2014-05-09 | Discharge: 2014-05-09 | Disposition: A | Discharge status: Home / Self Care | Payer: Medicaid Other | Attending: Emergency Medicine | Admitting: Emergency Medicine

## 2014-05-09 DIAGNOSIS — Z79899 Other long term (current) drug therapy: Secondary | ICD-10-CM | POA: Insufficient documentation

## 2014-05-09 DIAGNOSIS — R51 Headache: Secondary | ICD-10-CM | POA: Diagnosis present

## 2014-05-09 DIAGNOSIS — Z880 Allergy status to penicillin: Secondary | ICD-10-CM | POA: Insufficient documentation

## 2014-05-09 DIAGNOSIS — Z720 Tobacco use: Secondary | ICD-10-CM | POA: Diagnosis not present

## 2014-05-09 DIAGNOSIS — R519 Headache, unspecified: Secondary | ICD-10-CM

## 2014-05-09 MED ORDER — KETOROLAC TROMETHAMINE 30 MG/ML IJ SOLN
30.0000 mg | Freq: Once | INTRAMUSCULAR | Status: AC
Start: 2014-05-09 — End: 2014-05-09
  Administered 2014-05-09: 30 mg via INTRAVENOUS
  Filled 2014-05-09: qty 1

## 2014-05-09 MED ORDER — DIPHENHYDRAMINE HCL 50 MG/ML IJ SOLN
25.0000 mg | Freq: Once | INTRAMUSCULAR | Status: AC
  Administered 2014-05-09: 25 mg via INTRAVENOUS
  Filled 2014-05-09: qty 1

## 2014-05-09 MED ORDER — METOCLOPRAMIDE HCL 5 MG/ML IJ SOLN
10.0000 mg | Freq: Once | INTRAMUSCULAR | Status: AC
Start: 2014-05-09 — End: 2014-05-09
  Administered 2014-05-09: 10 mg via INTRAVENOUS
  Filled 2014-05-09: qty 2

## 2014-05-09 MED ORDER — SODIUM CHLORIDE 0.9 % IV BOLUS (SEPSIS)
1000.0000 mL | Freq: Once | INTRAVENOUS | Status: AC
  Administered 2014-05-09: 1000 mL via INTRAVENOUS

## 2014-05-09 NOTE — ED Provider Notes (Signed)
CSN: [id number]     Arrival date & time 05/09/14  1647 History  This chart was scribed for Donnetta Hutching, MD by Milly Jakob, ED Scribe. The patient was seen in room APA01/APA01. Patient's care was started at 6:27 PM.   Chief Complaint  Patient presents with  . Headache   The history is provided by the patient. No language interpreter was used.   HPI Comments: Phyllis Allen is a 38 y.o. female who presents to the Emergency Department complaining of a severe headache behind her left eye that radiates into her right frontal area, which began last night. She reports ocasional blurred vision in her left eye. She took [place] powder without relief. She reports that 2 months ago she was hit in the left eye with a softball. She states that she was seen in the hospital for a headache one time in the past at [facility], and that this was diagnosed as a sinus infection. She reports taking Tramadol for arthritis.   History reviewed. No pertinent past medical history. Past Surgical History  Procedure Laterality Date  . Tubal ligation     History reviewed. No pertinent family history. History  Substance Use Topics  . Smoking status: Current Every Day Smoker -- 0.50 packs/day    Types: Cigarettes  . Smokeless tobacco: Not on file  . Alcohol Use: No   OB History   Grav Para Term Preterm Abortions TAB SAB Ect Mult Living                 Review of Systems A complete 10 system review of systems was obtained and all systems are negative except as noted in the HPI and PMH.   Allergies  Amoxicillin and Ciprofloxacin  Home Medications   Prior to Admission medications   Medication Sig Start Date End Date Taking? Authorizing Provider  ALPRAZolam Prudy Feeler) 1 MG tablet Take 0.5-1 mg by mouth 3 (three) times daily as needed for anxiety.    Yes Historical Provider, MD  amphetamine-dextroamphetamine (ADDERALL) 30 MG tablet Take 30 mg by mouth 2 (two) times daily.   Yes Historical Provider, MD   Aspirin-Acetaminophen-Caffeine (GOODY HEADACHE PO) Take 1 packet by mouth daily as needed (for pain).   Yes Historical Provider, MD  lisdexamfetamine (VYVANSE) 70 MG capsule Take 70 mg by mouth daily.   Yes Historical Provider, MD   Triage Vitals: BP 103/76  Pulse 87  Temp(Src) 99.1 F (37.3 C) (Oral)  Resp 18  Ht 5\' 3"  (1.6 m)  Wt 135 lb (61.236 kg)  BMI 23.92 kg/m2  SpO2 100%  LMP 05/06/2014 Physical Exam  Nursing note and vitals reviewed. Constitutional: She is oriented to person, place, and time. She appears well-developed and well-nourished.  HENT:  Head: Normocephalic and atraumatic.  Eyes: Conjunctivae and EOM are normal. Pupils are equal, round, and reactive to light.  Neck: Normal range of motion. Neck supple.  Cardiovascular: Normal rate, regular rhythm and normal heart sounds.   Pulmonary/Chest: Effort normal and breath sounds normal.  Abdominal: Soft. Bowel sounds are normal.  Musculoskeletal: Normal range of motion.  Neurological: She is alert and oriented to person, place, and time.  Skin: Skin is warm and dry.  Psychiatric: She has a normal mood and affect. Her behavior is normal.    ED Course  Procedures (including critical care time) DIAGNOSTIC STUDIES: Oxygen Saturation is 100% on room air, normal by my interpretation.    COORDINATION OF CARE: 6:31 PM-Discussed treatment plan which includes IV fluids,  Toradol. Benadryl, and Reglan with pt at bedside and pt agreed to plan.   Results for orders placed during the hospital encounter of 04/26/13  LIPASE, BLOOD      Result Value Ref Range   Lipase 48  11 - 59 U/L  COMPREHENSIVE METABOLIC PANEL      Result Value Ref Range   Sodium 137  135 - 145 mEq/L   Potassium 4.3  3.5 - 5.1 mEq/L   Chloride 102  96 - 112 mEq/L   CO2 29  19 - 32 mEq/L   Glucose, Bld 84  70 - 99 mg/dL   BUN 10  6 - 23 mg/dL   Creatinine, Ser 1.610.86  0.50 - 1.10 mg/dL   Calcium 8.7  8.4 - 09.610.5 mg/dL   Total Protein 6.3  6.0 - 8.3 g/dL    Albumin 3.5  3.5 - 5.2 g/dL   AST 15  0 - 37 U/L   ALT 11  0 - 35 U/L   Alkaline Phosphatase 45  39 - 117 U/L   Total Bilirubin 0.2 (*) 0.3 - 1.2 mg/dL   GFR calc non Af Amer 86 (*) >90 mL/min   GFR calc Af Amer >90  >90 mL/min  CBC WITH DIFFERENTIAL      Result Value Ref Range   WBC 6.5  4.0 - 10.5 K/uL   RBC 4.17  3.87 - 5.11 MIL/uL   Hemoglobin 13.6  12.0 - 15.0 g/dL   HCT 04.540.4  40.936.0 - 81.146.0 %   MCV 96.9  78.0 - 100.0 fL   MCH 32.6  26.0 - 34.0 pg   MCHC 33.7  30.0 - 36.0 g/dL   RDW 91.413.1  78.211.5 - 95.615.5 %   Platelets 178  150 - 400 K/uL   Neutrophils Relative % 38 (*) 43 - 77 %   Neutro Abs 2.5  1.7 - 7.7 K/uL   Lymphocytes Relative 53 (*) 12 - 46 %   Lymphs Abs 3.5  0.7 - 4.0 K/uL   Monocytes Relative 5  3 - 12 %   Monocytes Absolute 0.3  0.1 - 1.0 K/uL   Eosinophils Relative 3  0 - 5 %   Eosinophils Absolute 0.2  0.0 - 0.7 K/uL   Basophils Relative 0  0 - 1 %   Basophils Absolute 0.0  0.0 - 0.1 K/uL  URINALYSIS, ROUTINE W REFLEX MICROSCOPIC      Result Value Ref Range   Color, Urine YELLOW  YELLOW   APPearance CLEAR  CLEAR   Specific Gravity, Urine 1.025  1.005 - 1.030   pH 6.5  5.0 - 8.0   Glucose, UA NEGATIVE  NEGATIVE mg/dL   Hgb urine dipstick NEGATIVE  NEGATIVE   Bilirubin Urine NEGATIVE  NEGATIVE   Ketones, ur NEGATIVE  NEGATIVE mg/dL   Protein, ur NEGATIVE  NEGATIVE mg/dL   Urobilinogen, UA 0.2  0.0 - 1.0 mg/dL   Nitrite NEGATIVE  NEGATIVE   Leukocytes, UA NEGATIVE  NEGATIVE  PREGNANCY, URINE      Result Value Ref Range   Preg Test, Ur NEGATIVE  NEGATIVE    Imaging Review No results found.   EKG Interpretation None      MDM   Final diagnoses:  Headache, unspecified headache type   No neurological deficits. Patient feels better. He fluids, IV Benadryl, IV Reglan, IV Toradol  I personally performed the services described in this documentation, which was scribed in my presence. The recorded information has been  reviewed and is accurate.    Donnetta HutchingBrian  Sakeenah Valcarcel, MD 05/09/14 [phone]

## 2014-05-09 NOTE — ED Notes (Addendum)
Pt c/o sharp "piercing" pains that started to left side of temple and now on both sides. States it lasts all day. Symptoms since yesterday. Dizziness with walking but states she is steady, "little" blurred vision that started today. Pt was hit with baseball to left eye 2 months ago. Alert/oriented. Pupils perrla. Pt has bruising under left eye that she states is still from the baseball 2 months ago.

## 2014-05-09 NOTE — Discharge Instructions (Signed)
Increase fluids. Rest in quiet dark room. °

## 2015-01-16 IMAGING — CT CT MAXILLOFACIAL W/O CM
3 of 4 series · 16 of 47 positions shown, 19 images · non-contrast
Comparison: 04/26/2013

CLINICAL DATA: Hit in left orbit with softball.

EXAM:
CT HEAD WITHOUT CONTRAST
CT MAXILLOFACIAL WITHOUT CONTRAST
TECHNIQUE: Multidetector CT imaging of the head and maxillofacial structures
were performed using the standard protocol without intravenous
contrast. Multiplanar CT image reconstructions of the maxillofacial
structures were also generated.

[Series 4: max st 2.0 h31s · axial · 0.32mm/px · z∈[+24,+156]mm · 10 of 78 slices shown, 13 images]
[im 8/78  brain]
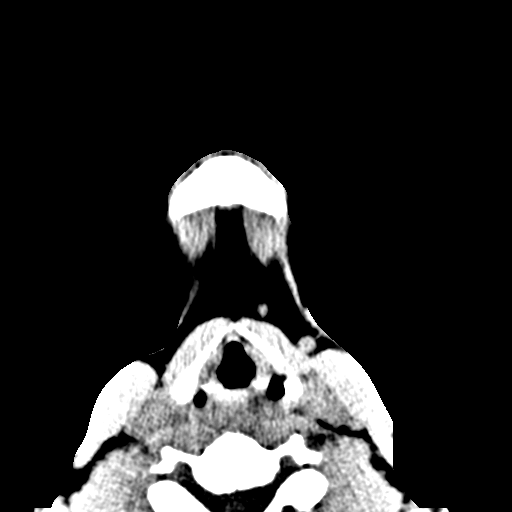
[im 8/78  bone]
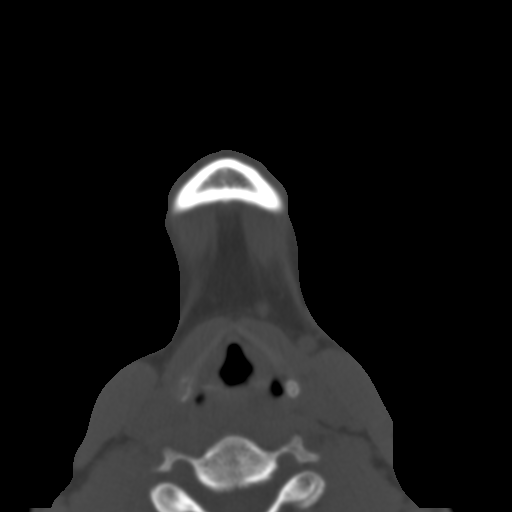
[im 15/78  bone]
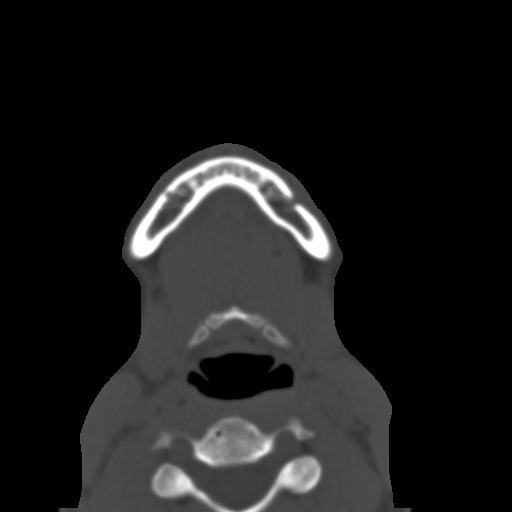
[im 23/78  bone]
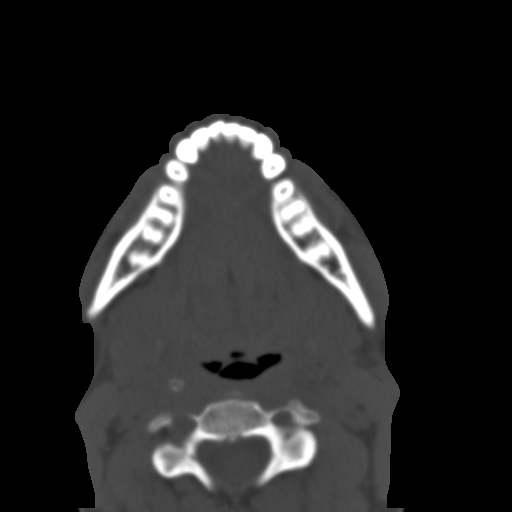
[im 30/78  bone]
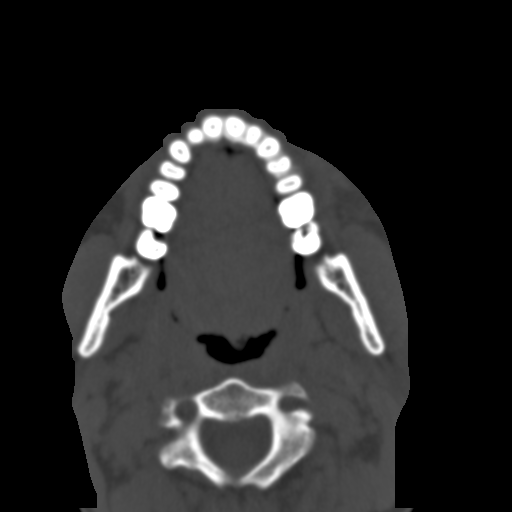
[im 37/78  brain]
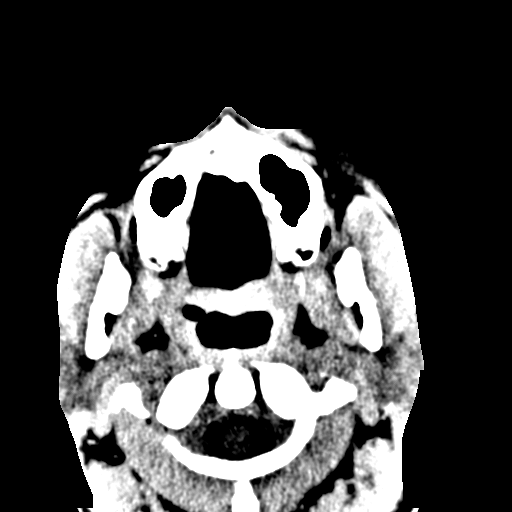
[im 37/78  bone]
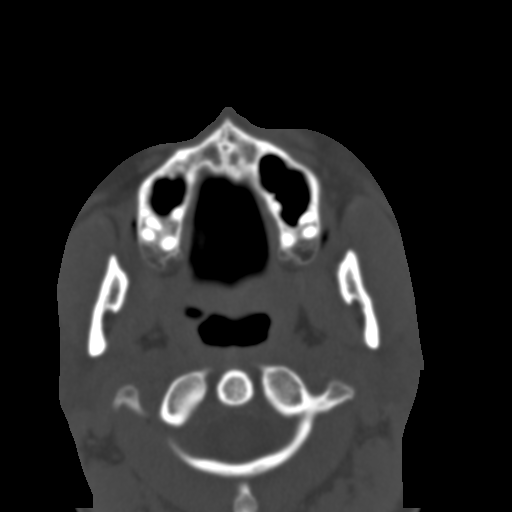
[im 45/78  bone]
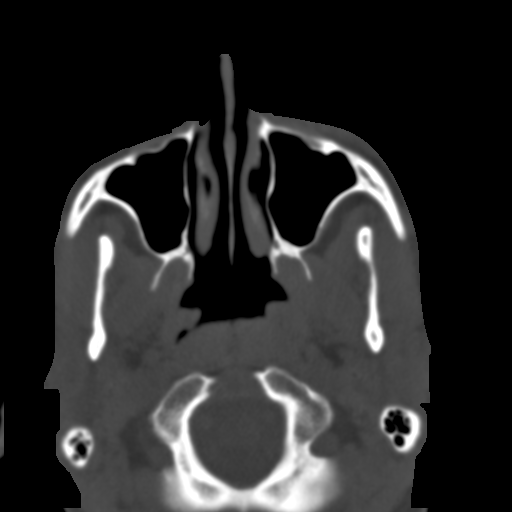
[im 52/78  bone]
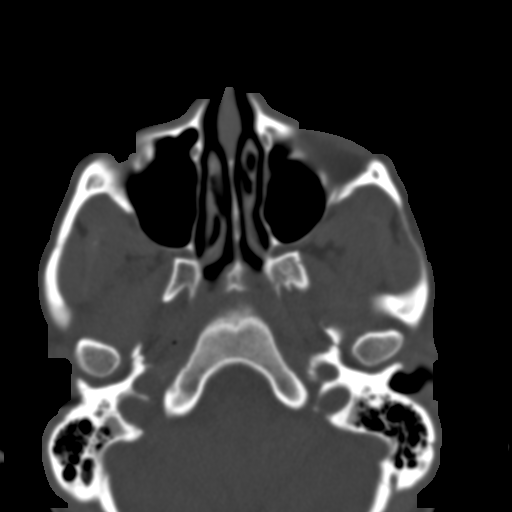
[im 59/78  bone]
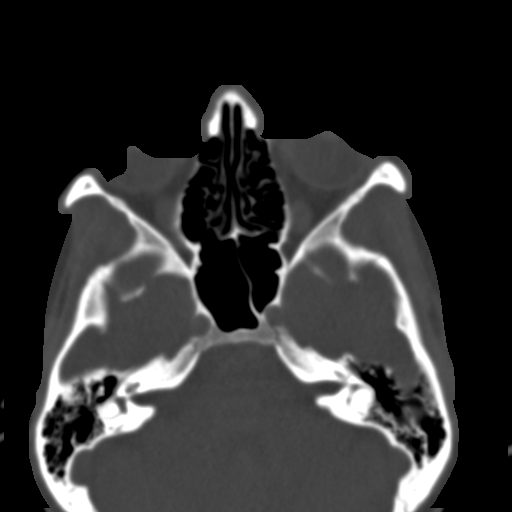
[im 67/78  brain]
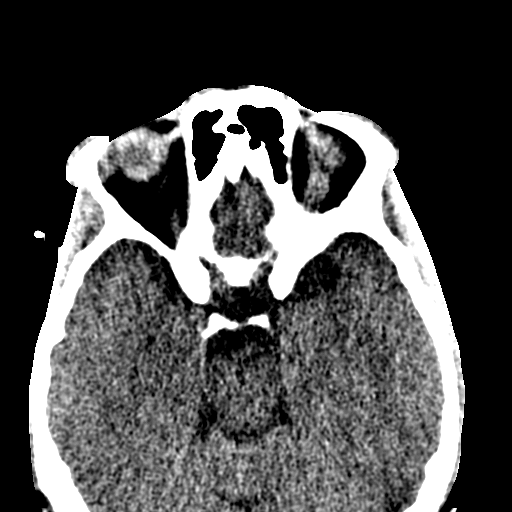
[im 67/78  bone]
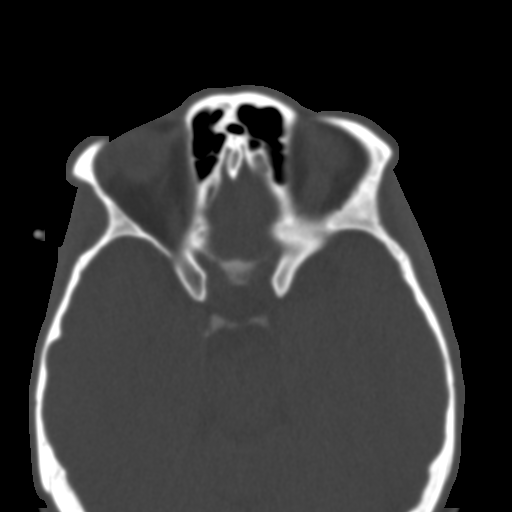
[im 74/78  bone]
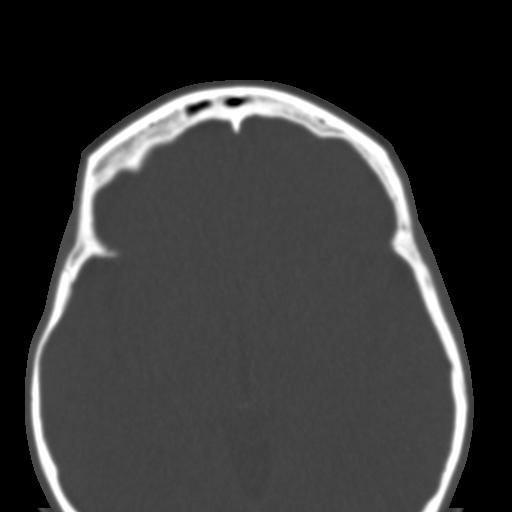

[Series 6: max st coronal · coronal · 0.32mm/px · 3 of 74 slices shown]
[im 25/74  bone]
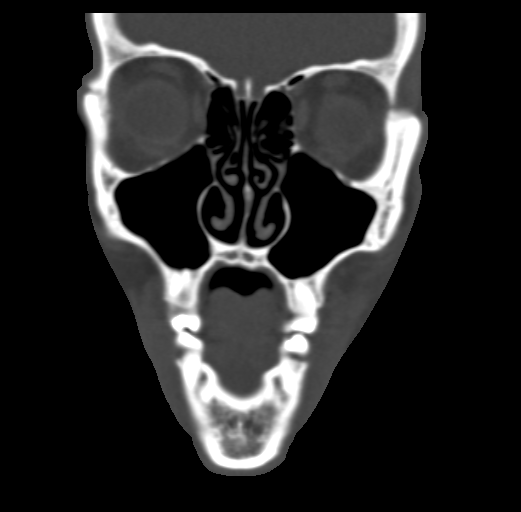
[im 33/74  bone]
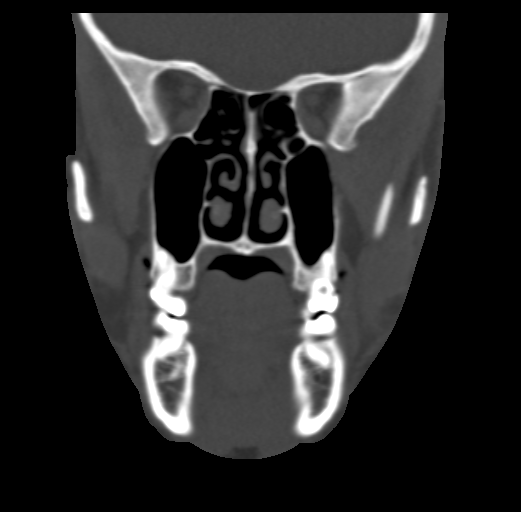
[im 41/74  bone]
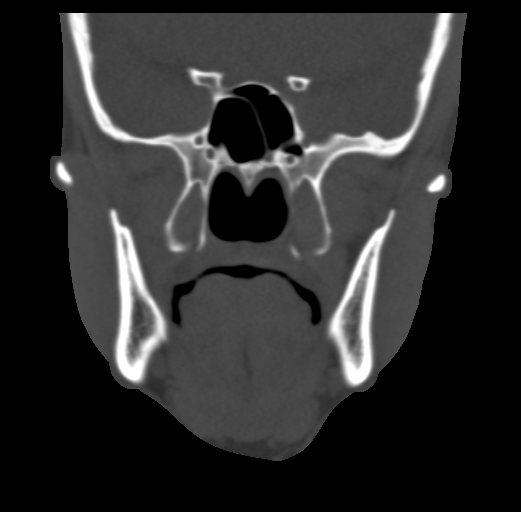

[Series 7: max st sag · sagittal · 0.32mm/px · 3 of 75 slices shown]
[im 25/75  bone]
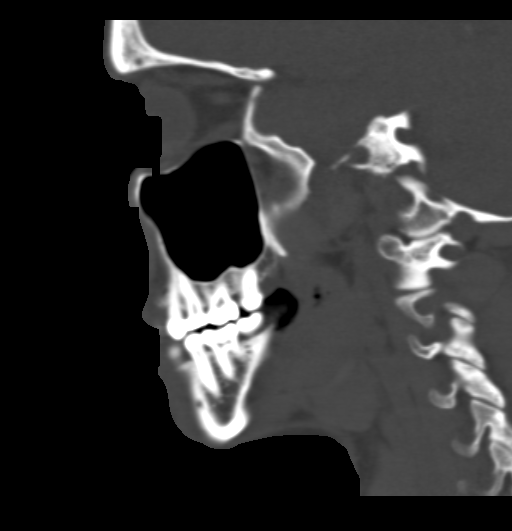
[im 38/75  bone]
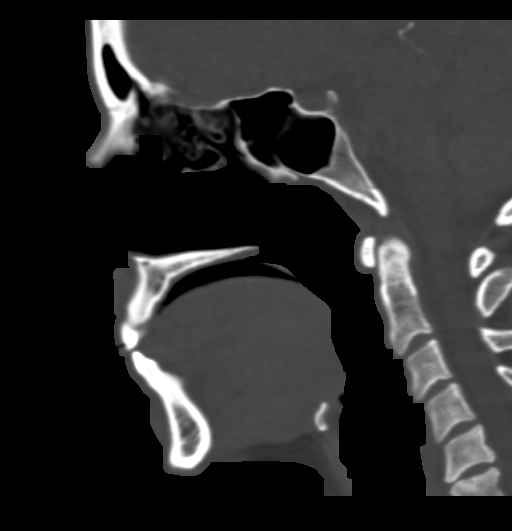
[im 50/75  bone]
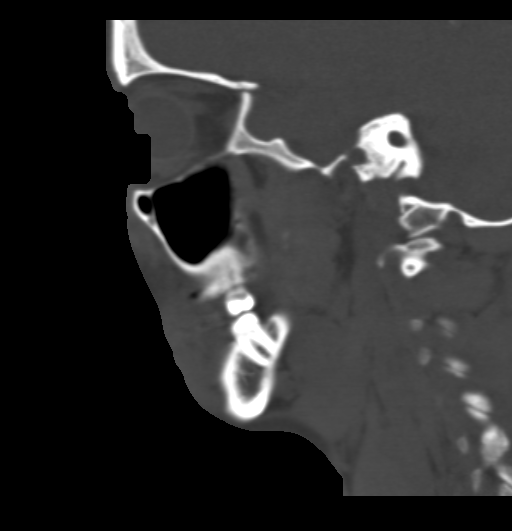

[16 of 47 positions shown; findings below may reference images not displayed]

FINDINGS: CT HEAD FINDINGS

No acute intracranial abnormality. Specifically, no hemorrhage,
hydrocephalus, mass lesion, acute infarction, or significant
intracranial injury. No acute calvarial abnormality. Visualized
paranasal sinuses and mastoids clear. Orbital soft tissues
unremarkable.

CT MAXILLOFACIAL FINDINGS

Soft tissue swelling over the left orbit and face. No orbital
fracture. Zygomatic arches and mandible intact. Nasal bones intact.
Paranasal sinuses clear. Orbital soft tissues unremarkable.
IMPRESSION: No evidence of orbital/facial fracture.

No acute intracranial abnormality.

## 2017-07-29 MED FILL — OXYCODONE-ACETAMINOPHEN 5-3: 5-325 | 3 days supply | Qty: 14 | Fill #0

## 2017-07-29 MED FILL — CEPHALEXIN 500 MG CAPSULE: 500 | 7 days supply | Qty: 14 | Fill #0

## 2017-07-29 MED FILL — TEMAZEPAM 15 MG CAPSULE: 15 | 15 days supply | Qty: 15 | Fill #0

## 2020-01-26 DIAGNOSIS — Z419 Encounter for procedure for purposes other than remedying health state, unspecified: Secondary | ICD-10-CM | POA: Diagnosis not present

## 2020-02-26 DIAGNOSIS — Z419 Encounter for procedure for purposes other than remedying health state, unspecified: Secondary | ICD-10-CM | POA: Diagnosis not present

## 2020-03-28 DIAGNOSIS — Z419 Encounter for procedure for purposes other than remedying health state, unspecified: Secondary | ICD-10-CM | POA: Diagnosis not present

## 2020-04-27 DIAGNOSIS — Z419 Encounter for procedure for purposes other than remedying health state, unspecified: Secondary | ICD-10-CM | POA: Diagnosis not present

## 2020-05-28 DIAGNOSIS — Z419 Encounter for procedure for purposes other than remedying health state, unspecified: Secondary | ICD-10-CM | POA: Diagnosis not present

## 2020-06-27 DIAGNOSIS — Z419 Encounter for procedure for purposes other than remedying health state, unspecified: Secondary | ICD-10-CM | POA: Diagnosis not present

## 2020-07-28 DIAGNOSIS — Z419 Encounter for procedure for purposes other than remedying health state, unspecified: Secondary | ICD-10-CM | POA: Diagnosis not present

## 2020-08-28 DIAGNOSIS — Z419 Encounter for procedure for purposes other than remedying health state, unspecified: Secondary | ICD-10-CM | POA: Diagnosis not present

## 2020-09-25 DIAGNOSIS — Z419 Encounter for procedure for purposes other than remedying health state, unspecified: Secondary | ICD-10-CM | POA: Diagnosis not present

## 2020-10-15 ENCOUNTER — Other Ambulatory Visit: Payer: Self-pay

## 2020-10-15 NOTE — Patient Instructions (Signed)
Visit Information  Ms. Phyllis Allen  - as a part of your Medicaid benefit, you are eligible for care management and care coordination services at no cost or copay. I was unable to reach you by phone today but would be happy to help you with your health related needs. Please feel free to call me @ [phone].   A member of the Managed Medicaid care management team will reach out to you again over the next 7 days.  Josue Kass, BSW, MHA [facility]  [place]  High Risk Managed Medicaid Team    

## 2020-10-15 NOTE — Patient Outreach (Signed)
Care Coordination  10/15/2020  Phyllis Allen 11-02-1975 [id number]   Medicaid Managed Care   Unsuccessful Outreach Note  10/15/2020 Name: Phyllis Allen MRN: [id number] DOB: Aug 12, 1975  Referred by: Patient, No Pcp Per Reason for referral : High Risk Managed Medicaid (MM Screen Unsuccessful Telephone Outreach)   An unsuccessful telephone outreach was attempted today. The patient was referred to the case management team for assistance with care management and care coordination.   Follow Up Plan: The care management team will reach out to the patient again over the next 7 days.   Gus Puma, BSW, [place] [facility]  [place]  High Risk Managed Medicaid Team

## 2020-10-24 ENCOUNTER — Other Ambulatory Visit: Payer: Self-pay

## 2020-10-24 NOTE — Patient Outreach (Signed)
Care Coordination  10/24/2020  [name] April 12, 1976 [id number]   Medicaid Managed Care   Unsuccessful Outreach Note  10/24/2020 Name: Phyllis Allen MRN: [id number] DOB: [birth year]  Referred by: Patient, No Pcp Per (Inactive) Reason for referral : High Risk Managed Medicaid (MM Screen Unsuccessful Telephone Outreach)   A second unsuccessful telephone outreach was attempted today. The patient was referred to the case management team for assistance with care management and care coordination.   Follow Up Plan: The care management team will reach out to the patient again over the next 7 days.   Gus Puma, BSW, [place] [facility]  [place]  High Risk Managed Medicaid Team

## 2020-10-24 NOTE — Patient Instructions (Signed)
Visit Information  Ms. Phyllis Allen  - as a part of your Medicaid benefit, you are eligible for care management and care coordination services at no cost or copay. I was unable to reach you by phone today but would be happy to help you with your health related needs. Please feel free to call me @ [phone].   A member of the Managed Medicaid care management team will reach out to you again over the next 7 days.  Gus Puma, BSW, [place] [facility]  [place]  High Risk Managed Medicaid Team

## 2020-10-26 DIAGNOSIS — Z419 Encounter for procedure for purposes other than remedying health state, unspecified: Secondary | ICD-10-CM | POA: Diagnosis not present

## 2020-11-02 ENCOUNTER — Other Ambulatory Visit: Payer: Self-pay

## 2020-11-02 NOTE — Patient Instructions (Signed)
Visit Information  Ms. Phyllis Allen  - as a part of your Medicaid benefit, you are eligible for care management and care coordination services at no cost or copay. I was unable to reach you by phone today but would be happy to help you with your health related needs. Please feel free to call me @ [phone].     Gus Puma, BSW, [place] [facility]  [place]  High Risk Managed Medicaid Team

## 2020-11-02 NOTE — Patient Outreach (Signed)
Care Coordination  11/02/2020  Phyllis Allen [birth year] [id number]   Medicaid Managed Care   Unsuccessful Outreach Note  11/02/2020 Name: Phyllis Allen MRN: [id number] DOB: 02/16/1976  Referred by: Patient, No Pcp Per (Inactive) Reason for referral : High Risk Managed Medicaid (MM Screen Unsuccessful Telephone Outreach)   Third unsuccessful telephone outreach was attempted today. The patient was referred to the case management team for assistance with care management and care coordination. The patient's primary care provider has been notified of our unsuccessful attempts to make or maintain contact with the patient. The care management team is pleased to engage with this patient at any time in the future should he/she be interested in assistance from the care management team.   Follow Up Plan: We have been unable to make contact with the patient for follow up. The care management team is available to follow up with the patient after provider conversation with the patient regarding recommendation for care management engagement and subsequent re-referral to the care management team.   Gus Puma, BSW, MHA [facility]  [place]  High Risk Managed Medicaid Team

## 2020-11-25 DIAGNOSIS — Z419 Encounter for procedure for purposes other than remedying health state, unspecified: Secondary | ICD-10-CM | POA: Diagnosis not present

## 2020-12-26 DIAGNOSIS — Z419 Encounter for procedure for purposes other than remedying health state, unspecified: Secondary | ICD-10-CM | POA: Diagnosis not present

## 2021-01-25 DIAGNOSIS — Z419 Encounter for procedure for purposes other than remedying health state, unspecified: Secondary | ICD-10-CM | POA: Diagnosis not present

## 2021-02-25 DIAGNOSIS — Z419 Encounter for procedure for purposes other than remedying health state, unspecified: Secondary | ICD-10-CM | POA: Diagnosis not present

## 2021-03-28 DIAGNOSIS — Z419 Encounter for procedure for purposes other than remedying health state, unspecified: Secondary | ICD-10-CM | POA: Diagnosis not present

## 2021-04-27 DIAGNOSIS — Z419 Encounter for procedure for purposes other than remedying health state, unspecified: Secondary | ICD-10-CM | POA: Diagnosis not present

## 2021-05-28 DIAGNOSIS — Z419 Encounter for procedure for purposes other than remedying health state, unspecified: Secondary | ICD-10-CM | POA: Diagnosis not present

## 2021-06-27 DIAGNOSIS — Z419 Encounter for procedure for purposes other than remedying health state, unspecified: Secondary | ICD-10-CM | POA: Diagnosis not present

## 2021-07-28 DIAGNOSIS — Z419 Encounter for procedure for purposes other than remedying health state, unspecified: Secondary | ICD-10-CM | POA: Diagnosis not present

## 2021-08-28 DIAGNOSIS — Z419 Encounter for procedure for purposes other than remedying health state, unspecified: Secondary | ICD-10-CM | POA: Diagnosis not present

## 2021-09-25 DIAGNOSIS — Z419 Encounter for procedure for purposes other than remedying health state, unspecified: Secondary | ICD-10-CM | POA: Diagnosis not present

## 2021-10-26 DIAGNOSIS — Z419 Encounter for procedure for purposes other than remedying health state, unspecified: Secondary | ICD-10-CM | POA: Diagnosis not present

## 2021-11-25 DIAGNOSIS — Z419 Encounter for procedure for purposes other than remedying health state, unspecified: Secondary | ICD-10-CM | POA: Diagnosis not present

## 2021-12-26 DIAGNOSIS — Z419 Encounter for procedure for purposes other than remedying health state, unspecified: Secondary | ICD-10-CM | POA: Diagnosis not present

## 2022-01-25 DIAGNOSIS — Z419 Encounter for procedure for purposes other than remedying health state, unspecified: Secondary | ICD-10-CM | POA: Diagnosis not present

## 2022-02-25 DIAGNOSIS — Z419 Encounter for procedure for purposes other than remedying health state, unspecified: Secondary | ICD-10-CM | POA: Diagnosis not present

## 2022-03-28 DIAGNOSIS — Z419 Encounter for procedure for purposes other than remedying health state, unspecified: Secondary | ICD-10-CM | POA: Diagnosis not present

## 2022-04-27 DIAGNOSIS — Z419 Encounter for procedure for purposes other than remedying health state, unspecified: Secondary | ICD-10-CM | POA: Diagnosis not present

## 2022-05-28 DIAGNOSIS — Z419 Encounter for procedure for purposes other than remedying health state, unspecified: Secondary | ICD-10-CM | POA: Diagnosis not present

## 2022-06-27 DIAGNOSIS — Z419 Encounter for procedure for purposes other than remedying health state, unspecified: Secondary | ICD-10-CM | POA: Diagnosis not present
